# Patient Record
Sex: Female | Born: 1990 | Race: White | Hispanic: No | Marital: Single | Smoking: Never smoker
Health system: Southern US, Community
[De-identification: ages and names within clinical notes are randomized; demographics above are authoritative.]

## PROBLEM LIST (undated history)

## (undated) HISTORY — PX: NO SIGNIFICANT SURGICAL HISTORY: 1000005

---
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
## 2013-11-25 ENCOUNTER — Ambulatory Visit
Admission: RE | Admit: 2013-11-25 | Discharge: 2013-11-25 | Disposition: A | Discharge status: Home / Self Care | Payer: PRIVATE HEALTH INSURANCE | Source: Ambulatory Visit | Attending: Sports Medicine | Admitting: Sports Medicine

## 2013-11-25 ENCOUNTER — Encounter: Payer: Self-pay | Admitting: Sports Medicine

## 2013-11-25 ENCOUNTER — Ambulatory Visit (INDEPENDENT_AMBULATORY_CARE_PROVIDER_SITE_OTHER): Payer: PRIVATE HEALTH INSURANCE | Admitting: Sports Medicine

## 2013-11-25 VITALS — BP 106/68 | Ht 65.0 in | Wt 145.0 lb

## 2013-11-25 DIAGNOSIS — M25569 Pain in unspecified knee: Secondary | ICD-10-CM

## 2013-11-25 DIAGNOSIS — M25562 Pain in left knee: Secondary | ICD-10-CM

## 2013-11-25 NOTE — Patient Instructions (Signed)
X-ray of your Left knee - $44.40  MRI of your left knee $469.60   [facility] [phone]  Must be paid at time of service

## 2013-11-25 NOTE — Progress Notes (Addendum)
   Subjective:    Patient ID: Madison Dougherty, female    DOB: 1991/06/07, 23 y.o.   MRN: [id number]  HPI  Ms. Zachery DakinsMikesell is a Education officer, environmentalclub tennis player from [employer] who presents as a referral from the Systems analystpersonal trainer for the club team. She is complaining of Left Knee pain which has been present since November 2014. She had a prior injury in August 2013 where she experienced a twisting injury to the knee. No immediate pop at that time but swelling and pain for a few days following the injury. She had gotten better and was performing at full strength without pain until November 2014. She was playing tennis when she stepped in a crack and her knee "went into itself and gave way." She does not think there was any twisting at that time. No pop but swelling. Her initial work-up did not include imaging and one physician said she would need surgery and the other did not.   She is still experiencing pain and swelling on the medial side of the knee with high impact activity--running, circuit training, tennis--as well as going upstairs. Pain is relieved with ice and Aleve. She has since changed her work-out routine to low-impact activities like biking and is not requiring pain medication. Denies locking or buckling but states that her knee feels unstable when swollen. She has never had surgery or injections in her knee.   Review of Systems Negative apart from HPI     Objective:   Physical Exam  Left Knee Inspection: no erythema or swelling Palpation: Tenderness along the medial joint line and femoral condyle on LEFT knee. ROM: Normal. Some crepitus with knee extension. Strength equal bilaterally Special Tests: Mildly positive Thessaly. Negative Anterior drawer, lockman, varus and valgus stress. Positive McMurray's.  US: Quadriceps and patellar tendons appear unremarkable. No joint effusion. There is some slight edema in the area of Hoffa's fat pad. Visualized portion of the medial meniscus shows no obvious  tear but there are hypoechoic changes surrounding the meniscus consistent with edema. Visualized lateral meniscus shows no surrounding hypoechoic change.     Assessment & Plan:  23 yo with 5 months of Left Knee pain worse with high-impact activity  1. Left Knee Pain- rule out medial meniscal tear versus OCD lesion  --X-Ray and MRI to evaluate  --Will call pt tomorrow to follow-up on X-Ray of knee and discuss whether or not she will get the MRI in [place] as it is out of network or go back home to [place]. If the decision is made to prolong the MRI, we discussed trying some meloxicam and a compression sleeve.  Seen with Howell RucksJessica Rein, MS4

## 2013-11-26 ENCOUNTER — Telehealth: Payer: Self-pay | Admitting: Sports Medicine

## 2013-11-26 NOTE — Telephone Encounter (Signed)
I spoke with the patient on the phone regarding x-rays of her knee. X-rays are unremarkable. No obvious OCD. We had discussed the possibility of an MRI during yesterday's visit. Patient's insurance is out of network so she is weighing her options in regards to cost. In the meantime, she is asking about the possibility of increasing her activity to include more impact type of sports such as running kickboxing. I think she is fine to start to weaning back into these types of activities. If her knee pain worsens or she develops significant swelling, then all options including MRI and prescription strength anti-inflammatories are available. At this point in time I will simply wait to hear back from her. Followup with me when necessary.

## 2015-03-28 IMAGING — CR DG KNEE COMPLETE 4+V*L*
4 series · 4 of 4 positions shown · non-contrast
Comparison: None.

CLINICAL DATA: Medial left knee pain

EXAM:
LEFT KNEE - COMPLETE 4+ VIEW

[t knee ap left (1 of 2)]
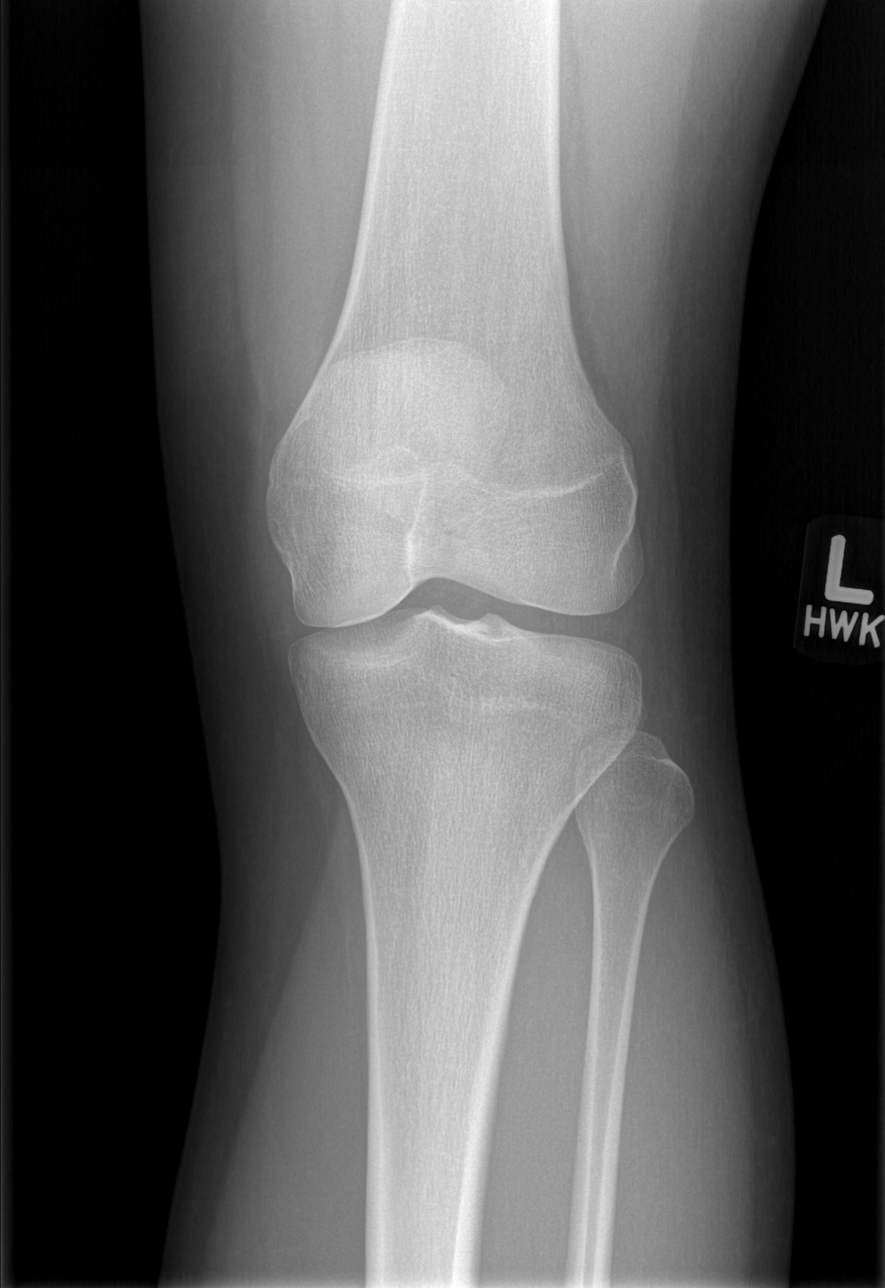

[t knee lat left]
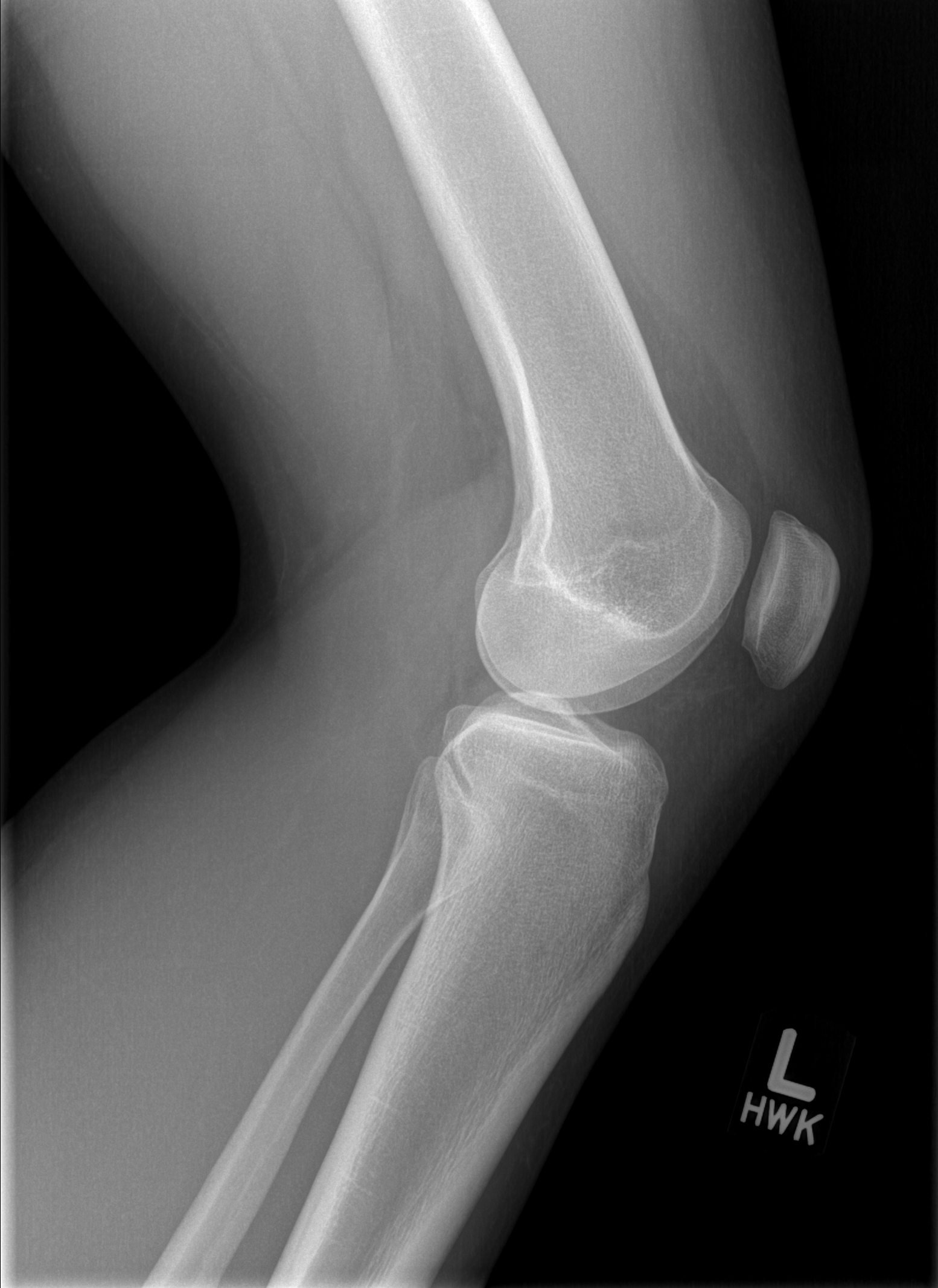

[t knee ap left (2 of 2)]
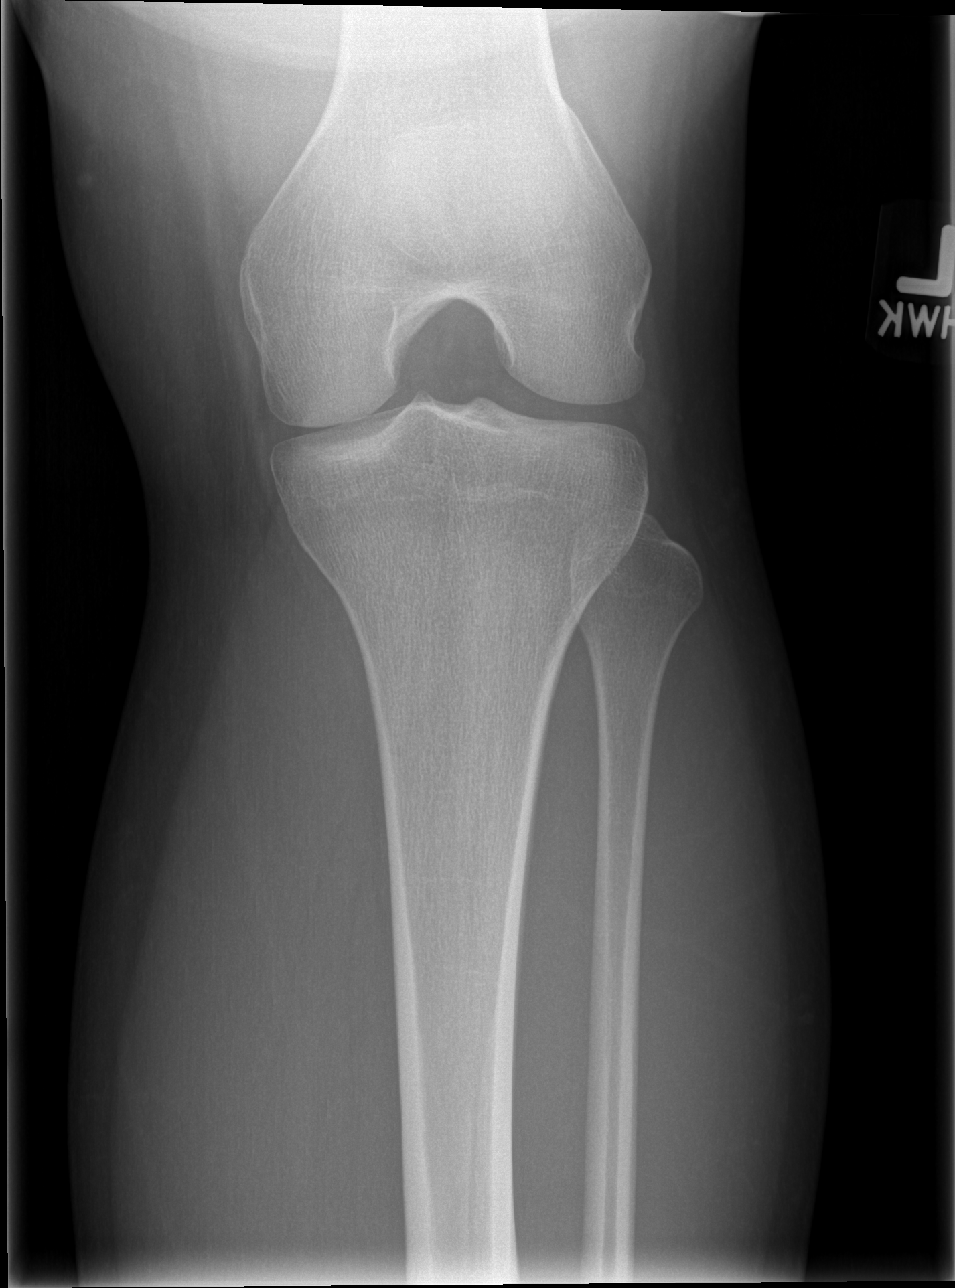

[view not recorded]
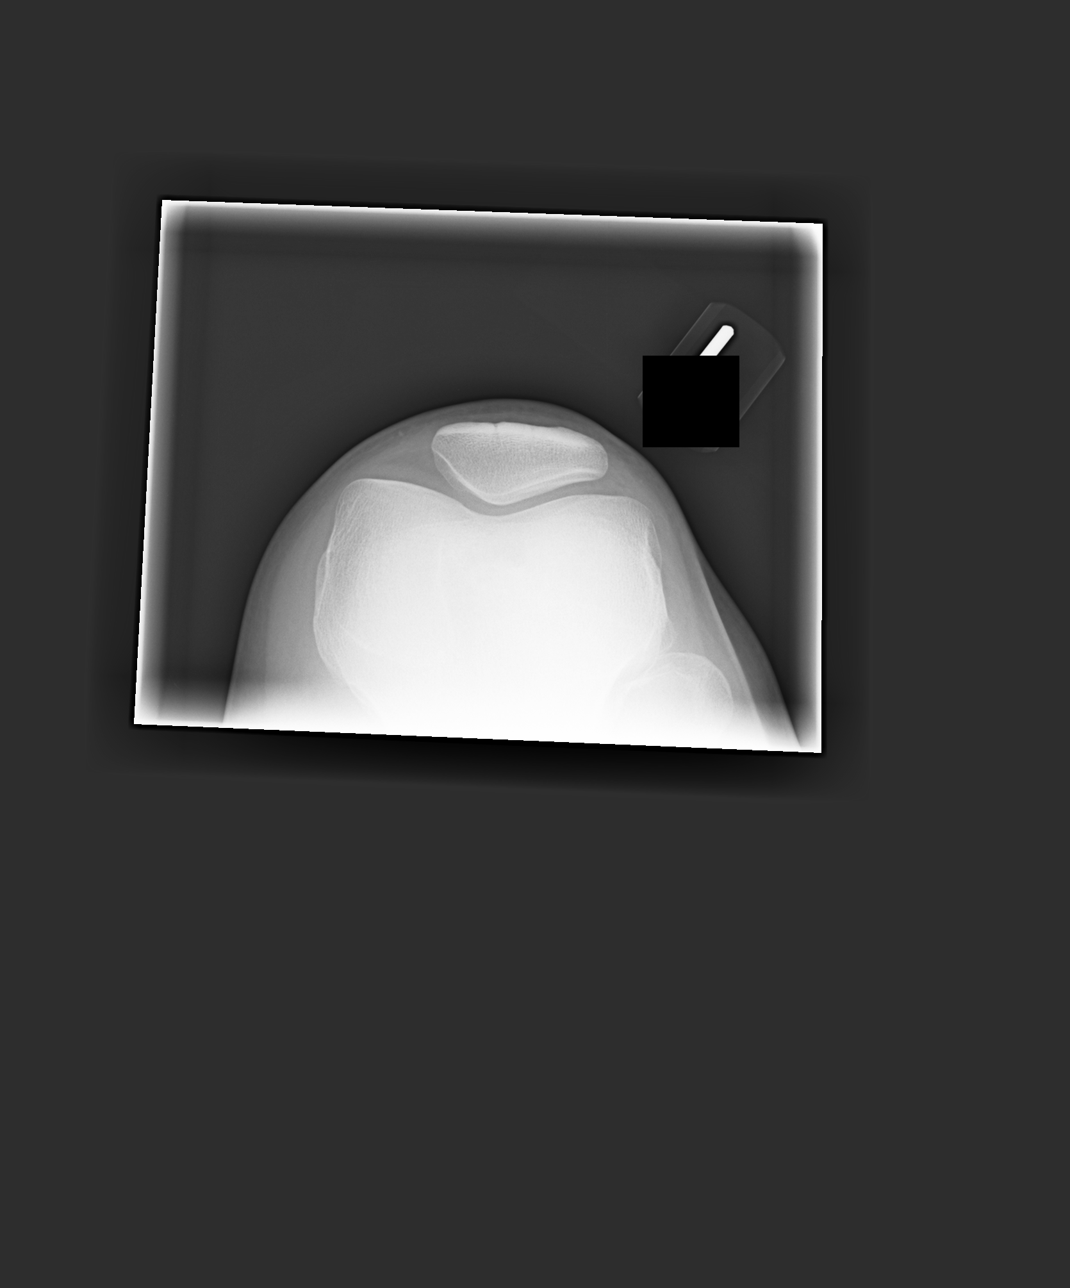

[4 of 4 positions shown; findings below may reference images not displayed]

FINDINGS: No fracture or dislocation is seen.

The joint spaces are preserved.

The visualized soft tissues are unremarkable.
IMPRESSION: Normal left knee radiographs.

## 2020-05-23 LAB — COVID-19 ANTIGEN CARE EVERYWHERE
COVID-19 ANTIGEN CARE EVERWHERE: NEGATIVE
EXPIRATION DATE CARE EVERYWHERE: 9252021
LOT NUMBER CARE EVERYWHERE: 6000445

## 2020-06-30 LAB — COVID-19 ANTIGEN CARE EVERYWHERE
COVID-19 ANTIGEN CARE EVERWHERE: NEGATIVE
EXPIRATION DATE CARE EVERYWHERE: 11102021
LOT NUMBER CARE EVERYWHERE: 6000577

## 2020-09-17 LAB — COVID-19 CARE EVERYWHERE: COVID-19 CARE EVERYWHERE: NOT DETECTED

## 2020-10-11 LAB — COVID-19 ANTIGEN CARE EVERYWHERE
COVID-19 ANTIGEN CARE EVERWHERE: NEGATIVE
EXPIRATION DATE CARE EVERYWHERE: 4192022
LOT NUMBER CARE EVERYWHERE: 6000772

## 2021-07-15 LAB — HEPATITIS C ANTIBODY CARE EVERYWHERE: HEPATITIS C ANTIBODY CARE EVERYWHERE: NEGATIVE

## 2022-06-24 ENCOUNTER — Other Ambulatory Visit: Payer: Self-pay

## 2022-06-24 ENCOUNTER — Encounter (HOSPITAL_BASED_OUTPATIENT_CLINIC_OR_DEPARTMENT_OTHER): Payer: Self-pay | Admitting: Family Medicine

## 2022-06-24 ENCOUNTER — Ambulatory Visit: Payer: BC Managed Care – POS | Attending: Family Medicine | Admitting: Family Medicine

## 2022-06-24 VITALS — BP 108/68 | HR 64 | Temp 98.1°F | Ht 66.0 in | Wt 141.2 lb

## 2022-06-24 DIAGNOSIS — Z7689 Persons encountering health services in other specified circumstances: Secondary | ICD-10-CM

## 2022-06-24 DIAGNOSIS — M5127 Other intervertebral disc displacement, lumbosacral region: Secondary | ICD-10-CM | POA: Diagnosis not present

## 2022-06-24 DIAGNOSIS — K921 Melena: Secondary | ICD-10-CM | POA: Diagnosis present

## 2022-06-24 DIAGNOSIS — K588 Other irritable bowel syndrome: Secondary | ICD-10-CM | POA: Diagnosis present

## 2022-06-24 LAB — CBC, PLATELET & DIFFERENTIAL
ABSOLUTE BASO COUNT: 0 10*3/uL (ref 0.0–0.1)
ABSOLUTE EOSINOPHIL COUNT: 0.1 10*3/uL (ref 0.0–0.8)
ABSOLUTE IMM GRAN COUNT: 0.02 10*3/uL (ref 0.00–0.10)
ABSOLUTE LYMPH COUNT: 2.3 10*3/uL (ref 0.6–5.9)
ABSOLUTE MONO COUNT: 0.4 10*3/uL (ref 0.2–1.4)
ABSOLUTE NEUTROPHIL COUNT: 4.4 10*3/uL (ref 1.6–8.3)
ABSOLUTE NRBC COUNT: 0 10*3/uL (ref 0.0–0.0)
BASOPHIL %: 0.4 % (ref 0.0–1.2)
EOSINOPHIL %: 1.2 % (ref 0.0–7.0)
HEMATOCRIT: 42.1 % (ref 34.1–44.9)
HEMOGLOBIN: 13.1 g/dL (ref 11.2–15.7)
IMMATURE GRANULOCYTE %: 0.3 % (ref 0.0–1.0)
LYMPHOCYTE %: 31.4 % (ref 15.0–54.0)
MEAN CORP HGB CONC: 31.1 g/dL (ref 31.0–37.0)
MEAN CORPUSCULAR HGB: 27.9 pg (ref 26.0–34.0)
MEAN CORPUSCULAR VOL: 89.8 fl (ref 80.0–100.0)
MEAN PLATELET VOLUME: 11.1 fL (ref 8.7–12.5)
MONOCYTE %: 5.6 % (ref 4.0–13.0)
NEUTROPHIL %: 61.1 % (ref 40.0–75.0)
NRBC %: 0 % (ref 0.0–0.0)
PLATELET COUNT: 218 10*3/uL (ref 150–400)
RBC DISTRIBUTION WIDTH STD DEV: 41.5 fL (ref 35.1–46.3)
RED BLOOD CELL COUNT: 4.69 M/uL (ref 3.90–5.20)
WHITE BLOOD CELL COUNT: 7.3 10*3/uL (ref 4.0–11.0)

## 2022-06-24 LAB — RBC SEDIMENTATION RATE: RBC SEDIMENTATION RATE: <1 MM/HR (ref 0–20)

## 2022-06-24 LAB — C-REACTIVE PROTEIN: C-REACTIVE PROTEIN: <3 mg/L (ref 0–18)

## 2022-06-24 LAB — FERRITIN: FERRITIN: 49 ng/mL (ref 13–150)

## 2022-06-24 MED ORDER — OMEPRAZOLE 20 MG PO CPDR
20.00 mg | DELAYED_RELEASE_CAPSULE | Freq: Every day | ORAL | 0 refills | Status: AC
Start: 2022-06-24 — End: 2022-07-24

## 2022-06-24 NOTE — Progress Notes (Signed)
[place] PRIMARY CARE NOTE - NEW PATIENT      SUBJECTIVE  Lori Avery is a 31 year old English-speaking female here for:    Physical (Requesting a referral for GI)    (obtained by [place] and reviewed)    Establish Care    Prior care site: reviewed via [place]: none  Records requested today by [place]: none     Problem List          Gastrointestinal and Abdominal    Other irritable bowel syndrome     Seen at GI at [facility]:  "She was diagnosed with IBS in [place] while working abroad, although records were not available. She did complete upper endoscopy and colonoscopy at that time. She recalls negative celiac and H. pylori studies. She recalled having esophagitis and gastritis on upper endoscopy. She was recommended to take Protonix 40 mg once daily and laboratories were ordered for H. pylori, stool calprotectin, CMP, CBC, although not completed. Recommended to see nutrition for a FODMAP diet and consider a low-dose trial of TCA. "    Plan:  1. Blood work Celiac Ttg, ESR, CRP, magnesium, TSH, and calprotectin  2. Pantoprazole 40 mg once daily for acid reflux  3. FODMAP diet + Dietician  4. Empiric treatment for SIBO with Rifaximin, Previously failed Doxy  5. Consider SIBO breath if refractory symptoms  6. Consider resuming probiotic after Rifaximin treatment  7. Consider neuromodulator ie. Buspar, Elavil             Neuro    Herniated nucleus pulposus of lumbosacral region     Seen at [facility]:  "left-sided lower back pain with radiation to the left posterior buttock, posterolateral thigh with paresthesias of the left calf into the plantar left foot. There is weakness of the left calf. The patient first experienced left-sided lower back pain approximately 2 years ago which was treated with medications and 8 months of outpatient physical therapy with relief. Approximately 6 months ago, she had recurrence of the back pain and began noticing radiation to the left lower extremity with  paresthesias. Her physical activity is significantly limited by the symptoms. Denies changes in gait. Denies bladder/bowel dysfunction. Denies recent fever, chills, nausea, vomiting. "  An MRI of the lumbar spine was performed on October 12, 2021 at [facility]. My independent review shows a large left-sided paracentral herniated nucleus pulposus at L5-S1 with compression of the descending S1 nerve root. There is some straightening of the normal lumbar lordosis. No acute fracture or spondylolisthesis identified.     Assessment: 1. 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  Plan for microdiscectomy at [facility] in August                 Active Medical Issues:  Patient Active Problem List:     Herniated nucleus pulposus of lumbosacral region     Other irritable bowel syndrome      No past medical history on file.    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  Interested in replacement when expired      Inconsistent condom use         SOGI form completed: yes    OBJECTIVE  BP 108/68   Pulse 64   Temp 98.1 F (36.7 C) (Temporal)   Ht 5' 6"  (1.676 m)   Wt 64 kg (141 lb 3.2 oz)   SpO2 99%   BMI 22.79 kg/m   Most Recent Weight  Reading(s)  06/24/22 : 64 kg (141 lb 3.2 oz)                Most Recent BP Reading(s)  06/24/22 : 108/68        Physical Exam   Gen in NAD  Abd nontender, +BS, no distention  MSK pos straight leg raise on L        ASSESSMENT AND PLAN  Lori MJESALYN FINAZZOis a 31year old female who presents with:         Problem List Items Addressed This Visit          Gastrointestinal and Abdominal    Other irritable bowel syndrome     [facility]  Started having gastritis again after steroid course for back pain - reactivated GER symptoms and restarted PPI  Now with some constipation, taking probiotics  [facility]         Relevant Orders    REFERRAL TO GASTROENTEROLOGY (EXT) (Completed)    IIV4 VACC PRESERV FREE AGE 3 MONTHS AND OLDER, 0.5ML, IM (Completed)    CALPROTECTIN FECAL    CELIAC PANEL (Completed)    FERRITIN (Completed)    C-REACTIVE PROTEIN (Completed)    RBC SEDIMENTATION RATE (Completed)    CBC, PLATELET & DIFFERENTIAL (Completed)    HEALTH CARE PROXY (Completed)       Neuro    Herniated nucleus pulposus of lumbosacral region     Getting used to pain and ignoring it but it continues to affect her function.           Relevant Medications    pregabalin (LYRICA) 25 MG capsule    tiZANidine (ZANAFLEX) 4 MG tablet    Other Relevant Orders    REFERRAL TO ORTHOPEDICS (EXT) (Completed)    REFERRAL TO PHYSICAL THERAPY (INT) (Completed)     Other Visit Diagnoses       Establishing care with new doctor, encounter for    -  Primary    Relevant Medications    tretinoin (RETIN-A) 0.025 % gel    omeprazole (PRILOSEC) 20 MG capsule    Other Relevant Orders  9VHPV VACC 2/3 DOSE SCHED IM USE (Completed)    MODERNA >12YO COVID-19 FALL 2023 (Completed)    IIV4 VACC PRESERV FREE AGE 74 MONTHS AND OLDER, 0.5ML, IM (Completed)    Blood in stool        Relevant Medications    Digestive Enzymes (DIGESTIVE ENZYME PO)    LACTOBACILLUS PROBIOTIC PO    etonogestrel (NEXPLANON) 68 MG Implant          GYN  History  Menarche: 31yo  Hx abnormal paps: no, Last pap: 04/2020 normal per patient.  Repeat last Oct was normal.  No hx of abnormal.  No hx of HPV vaccination    Hx abnormal mammo: none  Sexually active: yes  Contraception: Jadelle (per patient like Nexplanon, placed while [place], lasts 5 years).  Family hx breast, ovarian, colon ca: denies     ____________    Jeananne Rama, MD

## 2022-06-24 NOTE — Assessment & Plan Note (Signed)
Treated at [facility] for SIBO with good relief  Started having gastritis again after steroid course for back pain - reactivated GER symptoms and restarted PPI  Now with some constipation, taking probiotics  Referred to [facility]

## 2022-06-24 NOTE — Progress Notes (Signed)
Ayaan Ringle, RN, 06/24/2022      Vaccines administered as ordered per Benbasset-Miller, Cari, MD. Pt. has no known allergies that would prohibit receiving this immunization. Pt. tolerated shot well.    VIS given prior to administration and reviewed with the patient and or legal guardian. Patient understands the disease and the vaccine. See immunization/Injection module or chart review for date of publication and additional information.

## 2022-06-27 LAB — CELIAC PANEL: TISSUE TRANSGLUTAMINASE IgA: <0.5 U/mL (ref 0–14.9)

## 2022-06-30 ENCOUNTER — Encounter (HOSPITAL_BASED_OUTPATIENT_CLINIC_OR_DEPARTMENT_OTHER): Payer: Self-pay | Admitting: Family Medicine

## 2022-06-30 NOTE — Assessment & Plan Note (Signed)
Getting used to pain and ignoring it but it continues to affect her function.

## 2022-07-06 ENCOUNTER — Encounter (HOSPITAL_BASED_OUTPATIENT_CLINIC_OR_DEPARTMENT_OTHER): Payer: Self-pay

## 2022-07-14 ENCOUNTER — Encounter (HOSPITAL_BASED_OUTPATIENT_CLINIC_OR_DEPARTMENT_OTHER): Payer: Self-pay

## 2022-07-18 ENCOUNTER — Encounter (HOSPITAL_BASED_OUTPATIENT_CLINIC_OR_DEPARTMENT_OTHER): Payer: Self-pay

## 2022-07-27 ENCOUNTER — Encounter (HOSPITAL_BASED_OUTPATIENT_CLINIC_OR_DEPARTMENT_OTHER): Payer: Self-pay

## 2022-08-04 ENCOUNTER — Encounter (HOSPITAL_BASED_OUTPATIENT_CLINIC_OR_DEPARTMENT_OTHER): Payer: Self-pay

## 2022-08-10 ENCOUNTER — Encounter (HOSPITAL_BASED_OUTPATIENT_CLINIC_OR_DEPARTMENT_OTHER): Payer: Self-pay | Admitting: Family Medicine

## 2022-08-10 DIAGNOSIS — Z7689 Persons encountering health services in other specified circumstances: Secondary | ICD-10-CM

## 2022-08-10 NOTE — Telephone Encounter (Signed)
PER Patient (self), Lori Avery is a 31 year old female has requested a refill of tretinoin gel. (Historical)      Last Office Visit: 06-24-22 with pcp  Last Physical Exam: n/a    PAP SMEAR Never done    Other Med Adult:  Most Recent BP Reading(s)  06/24/22 : 108/68        No results found for: "CHOLESTEROL"  No results found for: "LDL"  No results found for: "HDL"  No results found for: "TG"      No results found for: "TSHSC"      No results found for: "TSH"    No results found for: "HGBA1C"    No results found for: "POCA1C"      No results found for: "INR"    No results found for: "NA"    No results found for: "K"        No results found for: "CREAT"    Documented patient preferred pharmacies:      [id number] - [place], [place] - [address] AT .  Phone: [phone] Fax: [phone]

## 2022-08-12 MED ORDER — TRETINOIN 0.025 % EX GEL
CUTANEOUS | 2 refills | Status: DC
Start: 2022-08-12 — End: 2024-02-07

## 2022-09-01 ENCOUNTER — Encounter (HOSPITAL_BASED_OUTPATIENT_CLINIC_OR_DEPARTMENT_OTHER): Payer: Self-pay | Admitting: Family Medicine

## 2022-09-09 ENCOUNTER — Other Ambulatory Visit: Payer: Self-pay

## 2022-09-09 ENCOUNTER — Ambulatory Visit: Payer: BC Managed Care – POS | Attending: Family Medicine

## 2022-09-09 DIAGNOSIS — M5441 Lumbago with sciatica, right side: Secondary | ICD-10-CM | POA: Insufficient documentation

## 2022-09-09 DIAGNOSIS — M5442 Lumbago with sciatica, left side: Secondary | ICD-10-CM | POA: Diagnosis present

## 2022-09-09 DIAGNOSIS — M5127 Other intervertebral disc displacement, lumbosacral region: Secondary | ICD-10-CM | POA: Insufficient documentation

## 2022-09-09 DIAGNOSIS — F4312 Post-traumatic stress disorder, chronic: Secondary | ICD-10-CM | POA: Diagnosis not present

## 2022-09-09 DIAGNOSIS — G8929 Other chronic pain: Secondary | ICD-10-CM | POA: Diagnosis present

## 2022-09-09 DIAGNOSIS — F411 Generalized anxiety disorder: Secondary | ICD-10-CM | POA: Diagnosis not present

## 2022-09-09 NOTE — Progress Notes (Signed)
OUTPATIENT PHYSICAL THERAPY EVALUATION    Referring Physician: Jeananne Rama,*  Date of Onset: 1 yr ago    Subjective: Pt is a 32 year old female who presents today with low back pain, L>R. Initially injured back 4 yrs ago, had PT which did help and then re-injured about 1 yr ago when moving. Describes pain as dull ache and gets numbness/tingling down L leg into her feet and R ant thigh which is a constant. L5-S1 herniated disc and the two segments above are bulging, confirmed by MRI. Had EMG performed last year in March which showed nerve damage on L side. Reports difficulties w sitting, lifting groceries, standing cook/clean, sports-like activities, don/doff clothes. States that she has done everything to help w muscle tightness w no sx relief.     Current Pain Level: 7/10   At Worst: 10/10  At Best: 4/10      Pt goal: return to PLOF, be able to participate in sports    Aggravating Factors: sitting, lifting groceries, standing cook/clean, sports-like activities, don/doff clothes  Alleviating Factors: muscle relaxer, tylenol, stretch, yoga, muscle gun, foam roller, icy/hot     Occupation: Office job   Dressing/Grooming: has been better as of late, still gets pain   Transportation: public transportation   Sleeping: normal     Pre-morbid Functional Level: Stage manager Deficits: sitting, lifting groceries, standing cook/clean, sports-like activities, don/doff clothes    Contraindications/Precautions: L5-S1 herniation, disc bulging   Patient Active Problem List:     Herniated nucleus pulposus of lumbosacral region     Other irritable bowel syndrome      Medications:  tretinoin (RETIN-A) 0.025 % gel, Apply to skin, avoid direct sun exposure, Disp: 45 g, Rfl: 2  Digestive Enzymes (DIGESTIVE ENZYME PO), Take 1 capsule by mouth, Disp: , Rfl:   LACTOBACILLUS PROBIOTIC PO, Take 1 capsule by mouth in the morning., Disp: , Rfl:   etonogestrel (NEXPLANON) 68 MG Implant, Inject 1 each into the skin, Disp: ,  Rfl:   pregabalin (LYRICA) 25 MG capsule, Take by mouth, Disp: , Rfl:   tiZANidine (ZANAFLEX) 4 MG tablet, Take 4 mg by mouth every 6 (six) hours as needed, Disp: , Rfl:     No current facility-administered medications on file prior to visit.      Primary Language: English ; Requires Interpreter: No     Objective:   Please Note: Only populated fields were assessed by provider, fields left blank were not assessed.     A/PROM  RESISTED ISOMETRICS    LEFT RIGHT  LEFT RIGHT   GROSS ROM    RI/OVER-  PRESSURE     CERVICAL   CERVICAL     FLEX.   FLEX.     EXT.   EXT.     SIDEBENDING   SIDEBENDING     ROTATION   ROTATION     THORACIC   THORACIC     FLEX   FLEX     EXT   EXT     SIDEBENDING   SIDEBENDING     ROTATAION   ROTATION     LUMBAR   LUMBAR     FLEX 100%, pain  FLEX     EXT 100%, pain  EXT     SIDEBENDING 100% 100%, pain SIDEBENDING     ROTATION 100%, pain 100% ROTATION        A/PROM  MMT    LEFT RIGHT  LEFT RIGHT   GROSS ROM  GROSS MMT     HIP   HIP     FLEX. WFL WFL FLEX 3, pain 3+, pain   EXT.   GLUT MAX     ABD   ABD 3 3+   ER [facility] ER 3+ 4-   IR WFL WFL IR 3+, pain 4-, pain   ADD   ADD     KNEE   KNEE     FLEX   HS 4-, ER leg 4-, ER leg   EXT   QUADS 4- 4   ANKLE   ANKLE     DF   ANT. TIB 4 5   PF   GASTROC     INV   INV     EV   EV     1st MTP EXT   1st MTP EXT       Active SLR:   R: impaired, pain in back, increased lumbar ext comp  L: impaired, pain in back, increased lumbar ext comp    MUSCLE LENGTH RIGHT LEFT   Hamstring Length Negative  Negative    Quad Length     Thomas Test     Ober's Test     Gastroc Length            PAVIMS    Lumbar CPA    Lumbar UPA    Long Axis Distraction    Traction L: painful  R: decreased sxs     Lower Quarter Screen: LEFT RIGHT   Myotomes     Dermatomes diminished Normal    Reflexes       Posture    Palpation B thoracic and lumbar paraspinals mod, L piriformis mod, R lat hip mod   Tissue Density B thoracic and lumbar paraspinal max     Functional Mobility    Gait B foot  supination w LR, decreased B foot pro, B decreased hip ext, decreased pelvic rotation B    B HR 10 reps     Balance LEFT RIGHT   SLS 15 sec, ankle strategy 15 sec, ankle strategy     Physical Therapy Plan of Care    MD: Horatio Pel, MD  Referring Provider: Horatio Pel,*    Diagnosis: LBP w sciatica     Assessment: Pt is a 32 year old female who presents with LBP w sciatica onset of 1 yr ago. Pt signs and symptoms are consistent with LBP d/t MOI and muscular weakness and tightness. Relevant co-morbidities include L5-S1 herniation, disc bulging. Pt current impairments are limiting her ability to perform the following functional tasks: sitting, lifting groceries, standing cook/clean, sports-like activities, don/doff clothes. Pt will benefit from skilled physical therapy to address the aforementioned concerns and to assist in return to prior level of function.    Short Term Functional Goals: 4 weeks  In 4 wks, pt will demonstrate independence w HEP.  In 4 wks, pt will demonstrate at least 4/5 in B hip, knee strength for improved ability to stand to cook/clean.  In 4 wks, pt will report pain no higher than a 7/10 throughout the day for progression back to PLOF.  In 4 wks, pt will demonstrate mod B thoracic and lumbar paraspinals TD for decreased sxs.  Assess lifting mechanics    Long Term Goal: 8 weeks  In 8 wks, pt will demonstrate at least 4+/5 B hip, knee, ankle MMT for return to PLOF.  In 8 wks, pt will demonstrate no B lumbar ext comp w SLR for  decreased sxs and improved quad contraction.  In 8 wks, pt will demonstrate min to no pain lumbar AROM for improved ability for don/doff clothes.  In 8 wks, pt will demonstrate no pain w lifting up to 15 lbs for improved ability to carry groceries.     Treatment Plan: ** PT Eval - Low Complexity (CPT [phone])  ** Stretching/ROM/Therapeutic Exercise [phone])  ** Home Exercise Program/ Patient Education (CPT [phone])  ** Neuromuscular Re-education (CPT  [phone])  ** Manual Therapy / Joint / Soft tissue Mobilization (CPT 97140)  ** Electrical Stimulation/TENS (CPT 97014)  ** Hot/Cold Rx (CPT 97010)  ** Gait Training (CPT [id number])  ** Functional Activities (CPT 97530)    Recommend physical therapy 1 times per week for 8 weeks, after which a reassessment for future physical therapy needs will occur.  The rehabilitation potential for this patient is good with a stable clinical presentation.    Patient Regis Bill is aware of attendance policy: Yes  Plan of care discussed with Patient/Family: Yes  Patient goals reviewed and incorporated in plan of care: Yes  Patient/Family agrees with plan of care: Yes  Patient/Family education: Yes  Does patient feel safe at home: Yes      Shirlyn Goltz PT, DPT  License #[id number]    [address], [place], [place] # [id number]

## 2022-09-09 NOTE — Progress Notes (Signed)
09/09/22 0900   Language Information   Language of Care English   Precautions   Precautions Yes   Other   Other (Comments) L5-S1 hernation, disc bulging   Rehab Discipline   Rehab Discipline PT   Visit   Visit number 1   POC Due date 10/10/22   Time Calculation   Start Time 0910   Stop Time 0955   Time Calculation (min) 45 min   Ther Exercise   Exercise TA brace   Ther Exercise 2   Exercise LTR   Ther Exercise 3   Exercise 3 bridges   Ther Exercise 4   Exercise 4 clamshells   Patient Education   What was taught? HEP, POC, role of PT, prognosis   Method Verbal;Demo;Practice;Written   Patient comprehension Yes       Shirlyn Goltz, [place], Lic # [id number]

## 2022-09-09 NOTE — Progress Notes (Signed)
I certify that the documented Treatment Plan is reasonable and necessary.    09/09/2022  Jeananne Rama, MD

## 2022-09-12 ENCOUNTER — Telehealth (HOSPITAL_BASED_OUTPATIENT_CLINIC_OR_DEPARTMENT_OTHER): Payer: Self-pay | Admitting: Family Medicine

## 2022-09-12 DIAGNOSIS — M545 Low back pain, unspecified: Secondary | ICD-10-CM

## 2022-09-12 DIAGNOSIS — F4312 Post-traumatic stress disorder, chronic: Secondary | ICD-10-CM | POA: Diagnosis not present

## 2022-09-12 NOTE — Telephone Encounter (Signed)
REFERRAL REQUEST- PROVIDER, PLEASE REVIEW AND SIGN ORDER IF APPROPRIATE      HOW IS REFERRAL BEING REQUESTED:  Phone    WHO IS REFERRAL BEING REQUESTED BY: Other: Lauren from [place]: Pain Medicine    DIAGNOSIS/CHIEF COMPLAINT: Back pain    HAVE YOU SEEN YOUR PCP FOR THIS ISSUE: yes    HAVE YOU SEEN YOUR PCP WITHIN THE LAST YEAR: yes    [facility]  [address]  [place]  [place], [place] [address]  [place]  Nanetta Batty, MD  NPI: [id number]   DOS 09/14/22

## 2022-09-14 DIAGNOSIS — M5416 Radiculopathy, lumbar region: Secondary | ICD-10-CM | POA: Diagnosis not present

## 2022-09-26 DIAGNOSIS — F4312 Post-traumatic stress disorder, chronic: Secondary | ICD-10-CM | POA: Diagnosis not present

## 2022-10-03 DIAGNOSIS — F4312 Post-traumatic stress disorder, chronic: Secondary | ICD-10-CM | POA: Diagnosis not present

## 2022-10-10 DIAGNOSIS — F4312 Post-traumatic stress disorder, chronic: Secondary | ICD-10-CM | POA: Diagnosis not present

## 2022-10-11 DIAGNOSIS — M5416 Radiculopathy, lumbar region: Secondary | ICD-10-CM | POA: Diagnosis not present

## 2022-10-11 DIAGNOSIS — K625 Hemorrhage of anus and rectum: Secondary | ICD-10-CM | POA: Diagnosis not present

## 2022-10-11 DIAGNOSIS — Z8719 Personal history of other diseases of the digestive system: Secondary | ICD-10-CM | POA: Diagnosis not present

## 2022-10-11 DIAGNOSIS — R1013 Epigastric pain: Secondary | ICD-10-CM | POA: Diagnosis not present

## 2022-10-11 DIAGNOSIS — Z83719 Family history of colon polyps, unspecified: Secondary | ICD-10-CM | POA: Diagnosis not present

## 2022-10-11 DIAGNOSIS — M357 Hypermobility syndrome: Secondary | ICD-10-CM | POA: Diagnosis not present

## 2022-10-11 DIAGNOSIS — R140 Abdominal distension (gaseous): Secondary | ICD-10-CM | POA: Diagnosis not present

## 2022-10-11 DIAGNOSIS — R197 Diarrhea, unspecified: Secondary | ICD-10-CM | POA: Diagnosis not present

## 2022-10-17 DIAGNOSIS — F4312 Post-traumatic stress disorder, chronic: Secondary | ICD-10-CM | POA: Diagnosis not present

## 2022-10-21 ENCOUNTER — Encounter (HOSPITAL_BASED_OUTPATIENT_CLINIC_OR_DEPARTMENT_OTHER): Payer: Self-pay | Admitting: Family Medicine

## 2022-11-02 DIAGNOSIS — M5416 Radiculopathy, lumbar region: Secondary | ICD-10-CM | POA: Diagnosis not present

## 2022-11-03 DIAGNOSIS — F4312 Post-traumatic stress disorder, chronic: Secondary | ICD-10-CM | POA: Diagnosis not present

## 2022-11-14 DIAGNOSIS — F4312 Post-traumatic stress disorder, chronic: Secondary | ICD-10-CM | POA: Diagnosis not present

## 2022-11-15 DIAGNOSIS — M5416 Radiculopathy, lumbar region: Secondary | ICD-10-CM | POA: Diagnosis not present

## 2022-11-21 DIAGNOSIS — F4312 Post-traumatic stress disorder, chronic: Secondary | ICD-10-CM | POA: Diagnosis not present

## 2022-11-22 DIAGNOSIS — R059 Cough, unspecified: Secondary | ICD-10-CM | POA: Diagnosis not present

## 2022-11-22 DIAGNOSIS — R55 Syncope and collapse: Secondary | ICD-10-CM | POA: Diagnosis not present

## 2022-11-22 DIAGNOSIS — R61 Generalized hyperhidrosis: Secondary | ICD-10-CM | POA: Diagnosis not present

## 2022-11-22 DIAGNOSIS — R079 Chest pain, unspecified: Secondary | ICD-10-CM | POA: Diagnosis not present

## 2022-11-22 DIAGNOSIS — R9431 Abnormal electrocardiogram [ECG] [EKG]: Secondary | ICD-10-CM | POA: Diagnosis not present

## 2022-11-22 DIAGNOSIS — R1013 Epigastric pain: Secondary | ICD-10-CM | POA: Diagnosis not present

## 2022-11-22 DIAGNOSIS — R0602 Shortness of breath: Secondary | ICD-10-CM | POA: Diagnosis not present

## 2022-11-22 DIAGNOSIS — R051 Acute cough: Secondary | ICD-10-CM | POA: Diagnosis not present

## 2022-11-22 DIAGNOSIS — R509 Fever, unspecified: Secondary | ICD-10-CM | POA: Diagnosis not present

## 2022-11-22 DIAGNOSIS — R042 Hemoptysis: Secondary | ICD-10-CM | POA: Diagnosis not present

## 2022-11-22 DIAGNOSIS — H5789 Other specified disorders of eye and adnexa: Secondary | ICD-10-CM | POA: Diagnosis not present

## 2022-11-22 DIAGNOSIS — R42 Dizziness and giddiness: Secondary | ICD-10-CM | POA: Diagnosis not present

## 2022-11-28 DIAGNOSIS — F4312 Post-traumatic stress disorder, chronic: Secondary | ICD-10-CM | POA: Diagnosis not present

## 2022-12-05 DIAGNOSIS — F4312 Post-traumatic stress disorder, chronic: Secondary | ICD-10-CM | POA: Diagnosis not present

## 2022-12-12 DIAGNOSIS — F4312 Post-traumatic stress disorder, chronic: Secondary | ICD-10-CM | POA: Diagnosis not present

## 2022-12-19 DIAGNOSIS — F4312 Post-traumatic stress disorder, chronic: Secondary | ICD-10-CM | POA: Diagnosis not present

## 2022-12-26 ENCOUNTER — Ambulatory Visit: Payer: BC Managed Care – POS | Attending: Family Medicine | Admitting: Family Medicine

## 2022-12-26 DIAGNOSIS — I959 Hypotension, unspecified: Secondary | ICD-10-CM | POA: Insufficient documentation

## 2022-12-26 DIAGNOSIS — G90A Postural orthostatic tachycardia syndrome (POTS): Secondary | ICD-10-CM | POA: Insufficient documentation

## 2022-12-26 DIAGNOSIS — F4312 Post-traumatic stress disorder, chronic: Secondary | ICD-10-CM | POA: Diagnosis not present

## 2022-12-26 DIAGNOSIS — R55 Syncope and collapse: Secondary | ICD-10-CM | POA: Diagnosis not present

## 2022-12-26 DIAGNOSIS — K588 Other irritable bowel syndrome: Secondary | ICD-10-CM | POA: Diagnosis not present

## 2022-12-26 MED ORDER — PROPRANOLOL HCL 10 MG PO TABS
10.0000 mg | ORAL_TABLET | Freq: Two times a day (BID) | ORAL | 2 refills | Status: AC
Start: 2022-12-26 — End: 2023-03-26

## 2022-12-26 MED ORDER — OTHER MEDICATION
0 refills | Status: AC
Start: 2022-12-26 — End: 2023-12-05

## 2022-12-26 NOTE — Assessment & Plan Note (Signed)
Established care with [facility] - plan for colonoscopy, pending a ride

## 2022-12-26 NOTE — Progress Notes (Signed)
[facility]LE[facility] RIMARY CARE NOTE - ESTABLISHED PATIENT      SUBJECTIVE  Aarian JAQUILA SANTELLI is a 32 year old English-speaking female here for:    No chief complaint on file.      Problem List          Gastrointestinal and Abdominal    Other irritable bowel syndrome     Seen at GI at [facility]:  "She was diagnosed with IBS in [place] while working abroad, although records were not available. She did complete upper endoscopy and colonoscopy at that time. She recalls negative celiac and H. pylori studies. She recalled having esophagitis and gastritis on upper endoscopy. She was recommended to take Protonix 40 mg once daily and laboratories were ordered for H. pylori, stool calprotectin, CMP, CBC, although not completed. Recommended to see nutrition for a FODMAP diet and consider a low-dose trial of TCA. "    Plan:  1. Blood work Celiac Ttg, ESR, CRP, magnesium, TSH, and calprotectin  2. Pantoprazole 40 mg once daily for acid reflux  3. FODMAP diet + Dietician  4. Empiric treatment for SIBO with Rifaximin, Previously failed Doxy  5. Consider SIBO breath if refractory symptoms  6. Consider resuming probiotic after Rifaximin treatment  7. Consider neuromodulator ie. Buspar, Elavil             Symptoms and Signs    Syncope and collapse - Primary     Reports hx of POTs diagnosed 23yrs ago  Occ has syncopal episodes                   Active Medical Issues:  Patient Active Problem List:     Herniated nucleus pulposus of lumbosacral region     Other irritable bowel syndrome     Syncope and collapse        OBJECTIVE  Deferred for televisit  by video:    Constitutional: Oriented to person, place, and time.  Appears well-developed and well-nourished. No distress.   Head: Normocephalic and atraumatic.   Mouth/Throat: Oropharynx is clear and moist. No oropharyngeal exudate.   Eyes: Pupils are equal, round, and reactive to light.   Neurological: Alert   Skin: Skin is warm and dry. No lesions visible  Psychiatric: Normal mood and affect.  Speech fluent and goal oriented.        ASSESSMENT AND PLAN  Tionna JALAN BODI is a 32 year old female who presents with:         Problem List Items Addressed This Visit          Gastrointestinal and Abdominal    Other irritable bowel syndrome     Established care with [facility] - plan for colonoscopy, pending a ride            Symptoms and Signs    Syncope and collapse - Primary     Most recently went to UC --> ED and required fluids  Labs reviewed and nml  Discussed principles of management including regular increase in fluids and salt in diet  Interested in salt tabs - reviewed dosing, combo with other sports drinks currently using  Pharmacologic management typically either bblocker or vasoconstrictor   Would help to have BP cuff for home management  Trial of propranalol - reviewed AE         Relevant Orders    THYROID SCREEN TSH REFLEX FT4     Other Visit Diagnoses       POTS (postural orthostatic tachycardia syndrome)  Symptomatic hypotension            ____________    Horatio Pel, MD

## 2022-12-26 NOTE — Assessment & Plan Note (Signed)
Most recently went to UC --> ED and required fluids  Labs reviewed and nml  Discussed principles of management including regular increase in fluids and salt in diet  Interested in salt tabs - reviewed dosing, combo with other sports drinks currently using  Pharmacologic management typically either bblocker or vasoconstrictor   Would help to have BP cuff for home management  Trial of propranalol - reviewed AE

## 2023-01-02 ENCOUNTER — Encounter (HOSPITAL_BASED_OUTPATIENT_CLINIC_OR_DEPARTMENT_OTHER): Payer: Self-pay

## 2023-01-02 ENCOUNTER — Telehealth (HOSPITAL_BASED_OUTPATIENT_CLINIC_OR_DEPARTMENT_OTHER): Payer: Self-pay | Admitting: Family Medicine

## 2023-01-02 DIAGNOSIS — F4312 Post-traumatic stress disorder, chronic: Secondary | ICD-10-CM | POA: Diagnosis not present

## 2023-01-02 NOTE — Telephone Encounter (Signed)
[facility] received an order for BP KIT. This has been forwarded to [employer] and scanned into media manager. Due to COVID-19 [facility] is using provider's signature on file to submit DME requests.    Please note DME requests can take 4 to 6 weeks to receive a decision.

## 2023-01-09 ENCOUNTER — Other Ambulatory Visit: Payer: Self-pay

## 2023-01-09 ENCOUNTER — Ambulatory Visit (HOSPITAL_BASED_OUTPATIENT_CLINIC_OR_DEPARTMENT_OTHER): Payer: BC Managed Care – POS | Admitting: Family Medicine

## 2023-01-16 DIAGNOSIS — F4312 Post-traumatic stress disorder, chronic: Secondary | ICD-10-CM | POA: Diagnosis not present

## 2023-01-23 DIAGNOSIS — F4312 Post-traumatic stress disorder, chronic: Secondary | ICD-10-CM | POA: Diagnosis not present

## 2023-01-29 DIAGNOSIS — H52223 Regular astigmatism, bilateral: Secondary | ICD-10-CM | POA: Diagnosis not present

## 2023-02-03 DIAGNOSIS — F4312 Post-traumatic stress disorder, chronic: Secondary | ICD-10-CM | POA: Diagnosis not present

## 2023-02-06 DIAGNOSIS — F4312 Post-traumatic stress disorder, chronic: Secondary | ICD-10-CM | POA: Diagnosis not present

## 2023-02-13 ENCOUNTER — Other Ambulatory Visit: Payer: Self-pay

## 2023-02-13 ENCOUNTER — Ambulatory Visit: Payer: BC Managed Care – POS | Attending: Family Medicine | Admitting: Family Medicine

## 2023-02-13 VITALS — BP 108/69 | HR 59 | Wt 140.0 lb

## 2023-02-13 DIAGNOSIS — K588 Other irritable bowel syndrome: Secondary | ICD-10-CM | POA: Diagnosis present

## 2023-02-13 DIAGNOSIS — R21 Rash and other nonspecific skin eruption: Secondary | ICD-10-CM | POA: Insufficient documentation

## 2023-02-13 DIAGNOSIS — Z3046 Encounter for surveillance of implantable subdermal contraceptive: Secondary | ICD-10-CM | POA: Insufficient documentation

## 2023-02-14 MED ORDER — OTHER MEDICATION
0 refills | Status: AC
Start: 2023-02-14 — End: 2024-02-20

## 2023-02-14 NOTE — Progress Notes (Signed)
Problem List Items Addressed This Visit          Gastrointestinal and Abdominal    Other irritable bowel syndrome     Other Visit Diagnoses       Rash and nonspecific skin eruption        Scattered erythematous patches on pelvis and trunk during IBS flares, fade on their own.  Will send pictures by MyChart, consider telederm           Contraceptive Implant Removal    32 year old presents for Etonogestrel removal due to: expiration.  Jadelle 2 rod implant placed while in [place], approx 82yrs ago  Declines alternate contreptive method today.    PATIENT/PROCEDURE VERIFICATION DOCUMENTATION  Correct patient: Yes  Correct procedure: Yes  Correct site, mark visible if applicable: Yes  Correct position: Yes  Special equipment/implant(s) present, if applicable: Yes    Time-out completed, documented by provider doing procedure or designated team member:  Horatio Pel, MD    02/14/2023    10:13 PM    The existing implant was palpated in the left upper arm. After informed consent was obtained, the area just distal to the implant was injected with 2cc of Lidocaine 1% with epinephrine for local anesthesia. Then using betadine for cleansing, with sterile technique, a 3mm incision was made distal to the implant. The implant was visualized and grasped with the straight hemostat. The entire implant rod was removed.   An additional implant was removed through the same incision. Hemostasis was achieved with pressure. A Steri-Strip and Band-Aid were then applied. The procedure was well tolerated without complications.    POST PAIN ASSESSMENT:  Post pain assessment done. Patient rates pain as a 0 on a 0-10 pain scale.    Estimated blood loss: <1cc    Advised to use back-up method or abstain x1 week if changing to a new method.  Instructed to call for any concerns.

## 2023-02-27 ENCOUNTER — Ambulatory Visit (HOSPITAL_BASED_OUTPATIENT_CLINIC_OR_DEPARTMENT_OTHER): Payer: Self-pay | Admitting: Ambulatory Care

## 2023-02-27 DIAGNOSIS — K581 Irritable bowel syndrome with constipation: Secondary | ICD-10-CM | POA: Diagnosis not present

## 2023-02-27 DIAGNOSIS — A041 Enterotoxigenic Escherichia coli infection: Secondary | ICD-10-CM | POA: Diagnosis not present

## 2023-02-27 NOTE — Telephone Encounter (Signed)
Reason for Disposition  . Travel to a foreign country in past month    Answer Assessment - Initial Assessment Questions  Received incoming call from the patient    Name and DOB verified    Patient calling to report symptoms of travelers diarrhea  Onset of symptoms last wed  She was in [place] x10 days     Diarrhea x 3 days   Diarrhea now resolved  Now reporting symptoms of constipation after taking more immodium that recommended on the package   Last BM was Friday    Pt has a hx of IBS, no abd pain at present time  +abd bloating    No fever  Mild nausea  No vomiting     LMP-6/15th  No chance of pregnancy    Advised per nursing triage protocol.  Verbalized understanding and agreement with instructions and disposition.     Recommended disposition for patient:Disposition: Advised to go to Urgent Care for Evaluation  Pt in [place] at present time     If patient referred to UC/ED advised that they may require further follow up and testing after the visit with their primary care office.     Instructed patient to call back for any new, worsening, or worrisome symptoms or concerns any time day or night.    Protocols used: Constipation-A-OH, Diarrhea-A-OH

## 2023-03-17 DIAGNOSIS — F4312 Post-traumatic stress disorder, chronic: Secondary | ICD-10-CM | POA: Diagnosis not present

## 2023-03-20 DIAGNOSIS — F4312 Post-traumatic stress disorder, chronic: Secondary | ICD-10-CM | POA: Diagnosis not present

## 2023-03-27 DIAGNOSIS — F4312 Post-traumatic stress disorder, chronic: Secondary | ICD-10-CM | POA: Diagnosis not present

## 2023-04-15 ENCOUNTER — Encounter (HOSPITAL_BASED_OUTPATIENT_CLINIC_OR_DEPARTMENT_OTHER): Payer: Self-pay | Admitting: Family Medicine

## 2023-04-18 ENCOUNTER — Encounter (HOSPITAL_BASED_OUTPATIENT_CLINIC_OR_DEPARTMENT_OTHER): Payer: Self-pay | Admitting: Family Medicine

## 2023-10-15 ENCOUNTER — Encounter (HOSPITAL_BASED_OUTPATIENT_CLINIC_OR_DEPARTMENT_OTHER): Payer: Self-pay | Admitting: Emergency Medicine

## 2023-10-15 ENCOUNTER — Other Ambulatory Visit: Payer: Self-pay

## 2023-10-15 ENCOUNTER — Emergency Department
Admission: AD | Admit: 2023-10-15 | Discharge: 2023-10-15 | Disposition: A | Discharge status: Home / Self Care | Payer: BC Managed Care – POS | Source: Ambulatory Visit | Attending: Emergency Medicine | Admitting: Emergency Medicine

## 2023-10-15 DIAGNOSIS — B9789 Other viral agents as the cause of diseases classified elsewhere: Secondary | ICD-10-CM | POA: Insufficient documentation

## 2023-10-15 DIAGNOSIS — R059 Cough, unspecified: Secondary | ICD-10-CM | POA: Diagnosis present

## 2023-10-15 DIAGNOSIS — J069 Acute upper respiratory infection, unspecified: Secondary | ICD-10-CM | POA: Insufficient documentation

## 2023-10-15 LAB — RESPIRATORY PANEL BASIC INPAT
INFLUENZA A: NEGATIVE
INFLUENZA B: NEGATIVE
RESPIRATORY SYNCYTIAL VIRUS: NEGATIVE
SARS-COV-2: NEGATIVE

## 2023-10-15 LAB — RAPID STREP (POINT OF CARE): STREP SCREEN: NEGATIVE

## 2023-10-15 MED ORDER — BENZONATATE 100 MG PO CAPS
100.0000 mg | ORAL_CAPSULE | Freq: Three times a day (TID) | ORAL | 0 refills | Status: AC | PRN
Start: 2023-10-15 — End: 2023-10-18

## 2023-10-15 NOTE — Discharge Instructions (Addendum)
Your strep throat test was negative.  Your symptoms are probably caused by a virus.  There are no antibiotics to treat viruses, but your body should get better on its own.  It can take a week or even two weeks before a virus will go away.  While you are sick, rest and drink plenty of fluids.  Try to use sports drinks like gatorade or powerade.      A humidifier may help with congestion.  Tea with honey or warm salt water gargles may help with sore throat.  You can keep taking the cough and cold medicine that you have at home but also you can try the benzonatate pill in addition.  This is to help suppress your cough.  This is only for adults and cannot be cut or crushed.    Please check in with your primary care doctor in 1-2 days if not better to make sure that you are healing.  Do try to measure your temperature at home as well so that your doctor has a sense of how high your fevers are.    You are contagious while you are sick.  You should stay away from other people until you have gone 24 hours without any fevers and without needing any fever medicine like Tylenol or ibuprofen.    GO TO THE EMERGENCY ROOM right away for continued high fevers, trouble breathing or swallowing, feeling very weak, or for any other concerns or worsening.

## 2023-10-15 NOTE — UC Provider Notes (Signed)
I have reviewed the UC nursing notes. I have reviewed the patient's past medical history/problem list, allergies, social history and medication list.  I saw this patient primarily.    HPI:  This is a 33 year old patient presenting with being sick since the start of the week, at midweek started feeling a little worse.  She got tested for flu, COVID, and strep at the start of the week she says although I can see that in the records, says she was told that if she continued to feel ill she should get retested because maybe it was too early.  Since midweek she has been feeling more congested, thinks maybe she is having fevers at night but has not gotten a thermometer.  Tolerating p.o.  Still has a sore throat.    History obtained by me from: Patient    ROS: Pertinent positives were reviewed as per the HPI above.     Past Medical History/Problem List:  History reviewed. No pertinent past medical history.  Patient Active Problem List:     Herniated nucleus pulposus of lumbosacral region     Other irritable bowel syndrome     Syncope and collapse      Past Surgical History:  Past Surgical History:  No date: NO SIGNIFICANT SURGICAL HISTORY    Medications:   No current facility-administered medications for this encounter.     Current Outpatient Medications   Medication Sig    OTHER MEDICATION Fodzyme enzyme supplement.  Take 1 stick PO with high FODMAP meals, PRN.    propranolol (INDERAL) 10 MG tablet Take 1 tablet by mouth in the morning and 1 tablet before bedtime.    OTHER MEDICATION Blood Pressure Monitoring Kit   Arm Circumference 30cm  For use as directed: "Please Dispense AMA validated cuff see [email or web address] (such as [employer])"       DX: POTS, Symptomatic Hypotension  ICD10:I95.9    Digestive Enzymes (DIGESTIVE ENZYME PO) Take 1 capsule by mouth    LACTOBACILLUS PROBIOTIC PO Take 1 capsule by mouth in the morning.    pregabalin (LYRICA) 25 MG capsule Take by mouth    tiZANidine (ZANAFLEX) 4 MG tablet Take 4 mg by  mouth every 6 (six) hours as needed       Social History:  Social History    Tobacco Use      Smoking status: Not on file      Smokeless tobacco: Not on file    Alcohol use: Not on file      Family History:   Review of patient's family history indicates:  Problem: Diabetes      Relation: Maternal Grandmother          Age of Onset: (Not Specified)  Problem: Cancer - Breast      Relation: FamHxNeg          Age of Onset: (Not Specified)  Problem: Cancer - Ovarian      Relation: FamHxNeg          Age of Onset: (Not Specified)       Allergies:  Review of Patient's Allergies indicates:   Metoclopramide          Anaphylaxis    Physical Exam:   10/15/23  1446   BP: 122/75   Pulse: 69   Resp: 15   Temp: 97.9 F   SpO2: 98%   Weight: 63.5 kg (140 lb)     GEN: No acute distress, appears comfortable  HEENT: Atraumatic, normocephalic.  Conjunctiva  clear.  OP: no trismus or drooling, uvula midline, min erythema, no exudate, nl voice, no neck tenderness  CARDIOVASC: Regular rate and rhythm, no murmurs, rubs, and gallops.    RESP: No respiratory distress, good air exchange, breath sounds CTAB.  NEURO: Alert and oriented, CN III-XII grossly nl, MAEx4, ambulatory without assistance  PSYCH: Appropriate affect and thought  SKIN: Warm, dry, no rashes or bruising visible        UC Course and Medical Decision-making:    The patient is a 33 year old with continued cough and sore throat, says she was told to get retested.  Strep negative again.  Viral swab sent.  Afebrile here with clear lungs, doubt pneumonia.  Did discuss measuring temperature at home if she think she is having fevers to help clarify the situation at this point given duration of illness.  Discussed reasons to seek ED care, home care and follow up plan.    Disposition:  Discharged    Condition: Stable    Diagnosis/Diagnoses:  Viral URI with cough      Lorenso Courier, MD

## 2023-10-15 NOTE — UC Triage Notes (Signed)
Patient states that she has had cough, runny nose and unmeasured fevers x 2 days.

## 2023-10-15 NOTE — Narrator Note (Signed)
Patient Disposition  Patient education for diagnosis, medications, activity, diet and follow-up.  Patient left UC 3:32 PM.  Patient rep received written instructions.    Interpreter to provide instructions: No    Patient belongings with patient: YES    Have all existing LDAs been addressed? N/A    Have all IV infusions been stopped? N/A    Destination: Home

## 2023-10-17 LAB — THROAT CULTURE BETA STREP

## 2023-10-27 ENCOUNTER — Encounter (HOSPITAL_BASED_OUTPATIENT_CLINIC_OR_DEPARTMENT_OTHER): Payer: Self-pay | Admitting: Family Medicine

## 2023-10-30 ENCOUNTER — Telehealth (HOSPITAL_BASED_OUTPATIENT_CLINIC_OR_DEPARTMENT_OTHER): Payer: Self-pay

## 2023-10-30 ENCOUNTER — Other Ambulatory Visit (HOSPITAL_BASED_OUTPATIENT_CLINIC_OR_DEPARTMENT_OTHER): Payer: Self-pay | Admitting: Family Medicine

## 2023-10-30 DIAGNOSIS — E079 Disorder of thyroid, unspecified: Secondary | ICD-10-CM

## 2023-10-30 NOTE — Telephone Encounter (Signed)
 Patient requesting lab work for Thyroid. Has televisit on 3/10 to discuss thyroid.  Will send request to provider.    Shafiq Larch RN

## 2023-11-13 ENCOUNTER — Encounter (HOSPITAL_BASED_OUTPATIENT_CLINIC_OR_DEPARTMENT_OTHER): Payer: Self-pay | Admitting: Family Medicine

## 2023-11-13 ENCOUNTER — Ambulatory Visit: Payer: BC Managed Care – POS | Admitting: Family Medicine

## 2023-11-13 DIAGNOSIS — Z5321 Procedure and treatment not carried out due to patient leaving prior to being seen by health care provider: Secondary | ICD-10-CM

## 2023-11-14 NOTE — Progress Notes (Signed)
 MEND not working during [employer], offered reschedule to later in the day.  Pt unable to attend 2nd time.  Apologized for technology difficulties.

## 2023-11-16 ENCOUNTER — Telehealth (HOSPITAL_BASED_OUTPATIENT_CLINIC_OR_DEPARTMENT_OTHER): Payer: Self-pay

## 2023-11-16 NOTE — Telephone Encounter (Signed)
 I called the patient and No voicemail available    If patient calls back, please: offer to reschedule 3/10 televisit per pt preferences.

## 2023-11-17 ENCOUNTER — Other Ambulatory Visit (HOSPITAL_BASED_OUTPATIENT_CLINIC_OR_DEPARTMENT_OTHER): Payer: Self-pay | Admitting: Family Medicine

## 2023-11-17 DIAGNOSIS — L659 Nonscarring hair loss, unspecified: Secondary | ICD-10-CM

## 2024-01-02 ENCOUNTER — Ambulatory Visit: Attending: Family Medicine | Admitting: Family

## 2024-01-02 DIAGNOSIS — A09 Infectious gastroenteritis and colitis, unspecified: Secondary | ICD-10-CM | POA: Diagnosis not present

## 2024-01-02 MED ORDER — ONDANSETRON HCL 4 MG PO TABS
4.0000 mg | ORAL_TABLET | Freq: Three times a day (TID) | ORAL | 0 refills | Status: AC | PRN
Start: 2024-01-02 — End: 2024-01-16

## 2024-01-02 NOTE — Progress Notes (Signed)
 Lori Avery is a 33 year old female patient of Benbasset-Miller, Davene Ernst, MD who is scheduled for a telemedicine visit for diarrhea.    SUBJECTIVE:    X 2 weeks   Initially had chills, sweating, felt feverish, and diarrhea   For the first week   Multiple episodes of diarrhea   Light colored stools   Across upper abdomen, intermittent    Traveled to [place], while she was traveling symptoms improved for a couple days then symptoms returned   Has diarrhea, nausea, and poor appetite   No vomiting   Eating a bland diet   Hx of IBS- mixed     Patient Active Problem List:     Herniated nucleus pulposus of lumbosacral region     Other irritable bowel syndrome     Syncope and collapse    OTHER MEDICATION, Fodzyme enzyme supplement.  Take 1 stick PO with high FODMAP meals, PRN., Disp: 1 Device, Rfl: 0  propranolol  (INDERAL ) 10 MG tablet, Take 1 tablet by mouth in the morning and 1 tablet before bedtime., Disp: 60 tablet, Rfl: 2  Digestive Enzymes (DIGESTIVE ENZYME PO), Take 1 capsule by mouth, Disp: , Rfl:   LACTOBACILLUS PROBIOTIC PO, Take 1 capsule by mouth in the morning., Disp: , Rfl:   pregabalin (LYRICA) 25 MG capsule, Take by mouth, Disp: , Rfl:   tiZANidine (ZANAFLEX) 4 MG tablet, Take 4 mg by mouth every 6 (six) hours as needed, Disp: , Rfl:     No current facility-administered medications on file prior to visit.    All medications reviewed with patient.    Review of Patient's Allergies indicates:   Metoclopramide          Anaphylaxis    Medical History reviewed with the patient.  Relevant changes have been made in 'history' section.    Recent laboratories and imaging reviewed prior to visit.    Review of Systems/HPI:  All other systems reviewed and negative.    OBJECTIVE:    Vital Signs:  Vitals not collected for this telemedicine visit.    Physical Exam:  General: speech clear at appropriate rate, speaking in full sentences  Psychiatric:  Mood and behavior normal     ASSESSMENT AND PLAN:  Additional plans  reviewed with patient and listed below under patient instructions and provided in AVS.    1. Diarrhea of infectious origin  Unclear etiology   ? IBS flare vs viral gastro vs traveler diarrhea   Stool culture ordered   Encouraged BRAT diet or well tolerated foods   Recommend plenty of fluids, small sips as tolerated   Ginger tea/candy for nausea   Rx'd zofran for nausea   Side effects and risks reviewed with patient/ parent/ guardian.   Tylenol as needed for fever/pain  Pt to follow up if symptoms worsen or do not improve   ED precautions reviewed with pt in detail     - ENTERIC BACTERIAL PANEL PCR; Future  - ondansetron (ZOFRAN) 4 MG tablet; Take 1 tablet by mouth every 8 (eight) hours as needed for Nausea  for up to 15 doses  Dispense: 10 tablet; Refill: 0      Reasons to call or return to clinic were discussed.    I explained the diagnosis and treatment plan, and the patient/parent/guardian expressed understanding of the content. We discussed all medicines prescribed and the importance of medication adherence. The patient/parent/guardian expressed understanding and no barriers to adherence were identified.  Possible side effects of the  prescribed medication(s) were explained.  I attempted to answer all questions regarding the diagnosis and the proposed treatment.    We discussed the patient's current medications including proper use and potential side effects. The patient expressed understanding and no barriers to adherence were identified.     1. The patient indicates understanding of these issues and agrees with the plan. Brief care plan is updated and reviewed with the patient.   2. The patient is given an After Visit Summary sheet that lists all medications with directions, allergies, orders placed during this encounter, and follow-up instructions.   3. I reviewed the patient's medical information and medical history   4. I have reviewed the pertinent medical, family, and social history sections including the  medications and allergies.    Gemma Kelp, NP

## 2024-02-06 ENCOUNTER — Encounter (HOSPITAL_BASED_OUTPATIENT_CLINIC_OR_DEPARTMENT_OTHER): Payer: Self-pay | Admitting: Family Medicine

## 2024-02-06 DIAGNOSIS — Z7689 Persons encountering health services in other specified circumstances: Secondary | ICD-10-CM

## 2024-02-07 MED ORDER — TRETINOIN 0.025 % EX GEL
CUTANEOUS | 2 refills | Status: AC
Start: 2024-02-07 — End: 2024-03-06

## 2024-02-07 NOTE — Telephone Encounter (Signed)
 PER Patient (self), Lori Avery is a 33 year old female has requested a refill of retin-a .      Last OFFICE/TELE Visit:  [id number] with adua      Last Physical Exam:   Not on file.     There are no preventive care reminders to display for this patient.    Other Med Adult:  Most Recent BP Reading(s)  10/15/23 : 122/75        No results found for: "CHOLESTEROL"  No results found for: "LDL"  No results found for: "HDL"  No results found for: "TG"      No results found for: "TSHSC"      No results found for: "TSH"    No results found for: "HGBA1C"    No results found for: "POCA1C"      No results found for: "INR"    No results found for: "NA"    No results found for: "K"        No results found for: "CREAT"    Documented patient preferred pharmacies:    [id number] - [place], [place] - [address] AT .  Phone: [phone] Fax: [phone]
